# Patient Record
Sex: Female | Born: 1980
Health system: Southern US, Community
[De-identification: ages and names within clinical notes are randomized; demographics above are authoritative.]

---

## 2000-01-29 ENCOUNTER — Other Ambulatory Visit: Admission: RE | Admit: 2000-01-29 | Discharge: 2000-01-29 | Payer: Self-pay | Admitting: Obstetrics and Gynecology

## 2003-02-04 ENCOUNTER — Other Ambulatory Visit: Admission: RE | Admit: 2003-02-04 | Discharge: 2003-02-04 | Payer: Self-pay | Admitting: Obstetrics and Gynecology

## 2004-02-17 ENCOUNTER — Other Ambulatory Visit: Admission: RE | Admit: 2004-02-17 | Discharge: 2004-02-17 | Payer: Self-pay | Admitting: Obstetrics and Gynecology

## 2005-02-01 ENCOUNTER — Ambulatory Visit: Payer: Self-pay | Admitting: Family Medicine

## 2005-04-17 ENCOUNTER — Other Ambulatory Visit: Admission: RE | Admit: 2005-04-17 | Discharge: 2005-04-17 | Payer: Self-pay | Admitting: Obstetrics and Gynecology

## 2005-06-25 ENCOUNTER — Ambulatory Visit: Payer: Self-pay | Admitting: Family Medicine

## 2005-10-23 ENCOUNTER — Encounter (INDEPENDENT_AMBULATORY_CARE_PROVIDER_SITE_OTHER): Payer: Self-pay | Admitting: Specialist

## 2005-10-23 ENCOUNTER — Inpatient Hospital Stay (HOSPITAL_COMMUNITY): Admission: AD | Admit: 2005-10-23 | Discharge: 2005-10-25 | Payer: Self-pay | Admitting: Obstetrics and Gynecology

## 2006-03-28 ENCOUNTER — Ambulatory Visit: Payer: Self-pay | Admitting: Family Medicine

## 2006-04-07 ENCOUNTER — Ambulatory Visit: Payer: Self-pay | Admitting: Family Medicine

## 2006-04-23 ENCOUNTER — Other Ambulatory Visit: Admission: RE | Admit: 2006-04-23 | Discharge: 2006-04-23 | Payer: Self-pay | Admitting: Obstetrics and Gynecology

## 2006-10-27 ENCOUNTER — Ambulatory Visit (HOSPITAL_COMMUNITY): Admission: RE | Admit: 2006-10-27 | Discharge: 2006-10-27 | Payer: Self-pay | Admitting: Orthopedic Surgery

## 2007-04-01 ENCOUNTER — Encounter (INDEPENDENT_AMBULATORY_CARE_PROVIDER_SITE_OTHER): Payer: Self-pay | Admitting: Orthopedic Surgery

## 2007-04-01 ENCOUNTER — Ambulatory Visit (HOSPITAL_BASED_OUTPATIENT_CLINIC_OR_DEPARTMENT_OTHER): Admission: RE | Admit: 2007-04-01 | Discharge: 2007-04-01 | Payer: Self-pay | Admitting: Orthopedic Surgery

## 2007-06-19 ENCOUNTER — Encounter: Payer: Self-pay | Admitting: Family Medicine

## 2007-10-26 ENCOUNTER — Ambulatory Visit (HOSPITAL_COMMUNITY): Admission: RE | Admit: 2007-10-26 | Discharge: 2007-10-26 | Payer: Self-pay | Admitting: Obstetrics and Gynecology

## 2008-07-27 ENCOUNTER — Ambulatory Visit: Payer: Self-pay | Admitting: Family Medicine

## 2008-07-27 DIAGNOSIS — J209 Acute bronchitis, unspecified: Secondary | ICD-10-CM | POA: Insufficient documentation

## 2008-12-08 ENCOUNTER — Inpatient Hospital Stay (HOSPITAL_COMMUNITY): Admission: AD | Admit: 2008-12-08 | Discharge: 2008-12-08 | Payer: Self-pay | Admitting: Obstetrics and Gynecology

## 2008-12-09 ENCOUNTER — Inpatient Hospital Stay (HOSPITAL_COMMUNITY): Admission: AD | Admit: 2008-12-09 | Discharge: 2008-12-09 | Payer: Self-pay | Admitting: Obstetrics and Gynecology

## 2009-01-12 ENCOUNTER — Inpatient Hospital Stay (HOSPITAL_COMMUNITY): Admission: AD | Admit: 2009-01-12 | Discharge: 2009-01-12 | Payer: Self-pay | Admitting: Obstetrics and Gynecology

## 2009-01-13 ENCOUNTER — Inpatient Hospital Stay (HOSPITAL_COMMUNITY): Admission: AD | Admit: 2009-01-13 | Discharge: 2009-01-13 | Payer: Self-pay | Admitting: Obstetrics and Gynecology

## 2009-01-31 ENCOUNTER — Inpatient Hospital Stay (HOSPITAL_COMMUNITY): Admission: RE | Admit: 2009-01-31 | Discharge: 2009-02-03 | Payer: Self-pay | Admitting: Obstetrics and Gynecology

## 2009-01-31 ENCOUNTER — Ambulatory Visit: Payer: Self-pay | Admitting: Obstetrics and Gynecology

## 2009-01-31 ENCOUNTER — Encounter (INDEPENDENT_AMBULATORY_CARE_PROVIDER_SITE_OTHER): Payer: Self-pay | Admitting: Obstetrics and Gynecology

## 2011-01-23 LAB — CBC
HCT: 30.7 % — ABNORMAL LOW (ref 36.0–46.0)
HCT: 34.9 % — ABNORMAL LOW (ref 36.0–46.0)
MCHC: 34.1 g/dL (ref 30.0–36.0)
MCHC: 35.4 g/dL (ref 30.0–36.0)
MCV: 91.7 fL (ref 78.0–100.0)
MCV: 93.8 fL (ref 78.0–100.0)
RBC: 3.81 MIL/uL — ABNORMAL LOW (ref 3.87–5.11)
RDW: 14.6 % (ref 11.5–15.5)

## 2011-01-23 LAB — RPR: RPR Ser Ql: NONREACTIVE

## 2011-01-29 LAB — URINALYSIS, ROUTINE W REFLEX MICROSCOPIC
Bilirubin Urine: NEGATIVE
Glucose, UA: NEGATIVE mg/dL
Hgb urine dipstick: NEGATIVE
Nitrite: NEGATIVE
Protein, ur: NEGATIVE mg/dL
Urobilinogen, UA: 0.2 mg/dL (ref 0.0–1.0)
pH: 6.5 (ref 5.0–8.0)

## 2011-02-26 NOTE — Op Note (Signed)
NAME:  Lindsey Barron, Lindsey Barron             ACCOUNT NO.:  [id number]   MEDICAL RECORD NO.:  [id number]          PATIENT TYPE:  INP   LOCATION:  9108                          FACILITY:  [facility]   PHYSICIAN:  Sherron Monday, MD        DATE OF BIRTH:  01-12-1981   DATE OF PROCEDURE:  01/31/2009  DATE OF DISCHARGE:                               OPERATIVE REPORT   PREOPERATIVE DIAGNOSIS:  Intrauterine pregnancy at 33 plus 6 weeks,  twins, breech presentation of baby A, spontaneous rupture of membranes.   POSTOPERATIVE DIAGNOSIS:  Intrauterine pregnancy at 33 plus 6 weeks,  twins, breech presentation of baby A, spontaneous rupture of membranes,  delivered via LTCS.   PROCEDURE:  Primary low transverse cesarean section.   SURGEON:  Sherron Monday, MD   ANESTHESIA:  Spinal.   FINDINGS:  Viable female infant at 13:07 with Apgars of 9 at one minute  and 9 at five minutes, and weight of 4 pounds 11 ounces.  Viable female  infant at 13:08 with Apgars of 8 at one minute and 9 at five minutes,  and weight of 4 pounds 3 ounces.  Normal uterus, tubes, and no ovaries.   COMPLICATIONS:  None.   SPECIMENS:  Placenta to Pathology.   ESTIMATED BLOOD LOSS:  600 mL.   IV FLUIDS:  2600 mL.   URINE OUTPUT:  200 mL clear urine at the end of the procedure.   DISPOSITION:  Stable to PACU.   PROCEDURE:  After informed consent was reviewed with the patient and her  husband including risks, benefits, and alternatives of cesarean section,  she was transported to the operating room where spinal anesthesia was  induced and found to be adequate.  She was then placed in supine  position with a leftward tilt, prepped and draped in the normal sterile  fashion.  A Pfannenstiel skin incision was made approximately at the  level of approximately two fingerbreadths above the pubic symphysis and  carried through the underlying layer of fascia sharply.  Fascia was  incised in the midline and incision was extended laterally with  Mayo  scissors.  The inferior aspect of the fascial incision was grasped with  clamps and elevated, the rectus muscles were dissected off both bluntly  and sharply.  Attention was then turned to the superior aspect of the  incision in a similar fashion, grasped with Kocher clamps, elevating the  rectus muscles were dissected off both bluntly and sharply.  Midline was  easily identified and the peritoneum was entered bluntly and the  incision was extended superiorly and inferiorly with good visualization  of the bladder.  The Alexis skin retractor was placed, carefully checked  to make sure no bowel was entrapped.  The vesicouterine peritoneum was  easily identified, tented up with pickups and the bladder flap created  digitally and sharply.  The uterus was incised in transverse fashion.  Baby A was delivered from a frank breech presentation without  complications.  Her nose and mouth were suctioned.  Cord was clamped and  cut, and baby was handed off to awaiting pediatric staff.  Baby B's bag  was then ruptured, delivered from a vertex presentation.  Nose and mouth  were suctioned.  Cord was clamped and baby was handed off to awaiting  pediatric staff.  The placenta was delivered.  The uterus was cleared of  all clots and debris.  Uterine incision was closed with 2 layers of 0  Monocryl, the first of which is a running locked and the second is an  imbricating.  Hemostasis was assured.  Copious pelvic irrigation was  performed.  The Alexis skin retractor was removed. The peritoneum was  reapproximated with 2-0 vicryl.  Subfascial planes were inspected, found  be hemostatic.  Fascia was closed with single suture of 0 Vicryl,  subcuticular adipose layer was made hemostatic with Bovie cautery,  reapproximated with 3-0 Vicryl and the skin was closed with 3-0 Vicryl.  Benzoin and Steri-Strips were applied.  Sponge, lap, and, needle counts  were correct x2 at the end of the procedure.       Sherron Monday, MD  Electronically Signed     JB/MEDQ  D:  01/31/2009  T:  02/01/2009  Job:  [id number]

## 2011-02-26 NOTE — Discharge Summary (Signed)
Lindsey Barron, Lindsey Barron             ACCOUNT NO.:  [id number]   MEDICAL RECORD NO.:  [id number]          PATIENT TYPE:  INP   LOCATION:  9108                          [facility]ACILITY:  [facility]   PHYSICIAN:  Sherron Monday, MD        DATE OF BIRTH:  02/23/1981   DATE OF ADMISSION:  01/31/2009  DATE OF DISCHARGE:  02/03/2009                               DISCHARGE SUMMARY   ADMITTING DIAGNOSES:  1. Intrauterine pregnancy at 33+ weeks.  2. Term pregnancy.  3. Delivery secondary to preterm premature rupture of membranes and      laboring  4. Breech presentation of twin A.   DISCHARGE DIAGNOSES:  1. Intrauterine pregnancy at 33+ weeks.  2. Term pregnancy.  3. Delivery secondary to preterm premature rupture of membranes and      laboring  4. Breech presentation of twin A.  5. Delivered by a primary low transverse cesarean section.   HISTORY OF PRESENT ILLNESS:  Please see the dictated H&P for complete  history.  However, in brief, a 30 year old G2, P 1-0-0-1 at 87 plus 6  weeks with spontaneous rupture of membranes, gross rupture of baby A,  who comes in the office with contractions that are increasing in  intensity and frequency, transferred to the hospital where a primary low  transverse cesarean section was performed without complication with an  EBL of 600 mL and delivery of a viable female infant at 67 on the  20th, Apgars at 9 at 1 minute and 9 at 5 minutes, and a weight of 4  pounds 11 ounces.  Viable female infant at 68 with Apgars of 8 at 1  minute and 9 at 5 minutes, and a weight of 4 pounds 3 ounces.  Normal  uterus, tubes, and ovaries were noted.  Her postoperative course was  relatively uncomplicated.  She remained afebrile and vital signs stable  throughout her course.  On the day of discharge, postoperative day #3,  she was ambulating well, voiding, tolerating a diet, had normal lochia,  and her pain was well controlled.  She requested discharge to home.  She  was discharged to  home with routine discharge instructions, numbers to  call if any questions or problems, as well as prescriptions for Motrin,  Vicodin, prenatal vitamins and instructions to follow up in 2 weeks. At  the time of discharge, baby A will be discharged with the mother, baby B  was in the NICU on room air to feeding and growing.   DISCHARGE INFORMATION:  Her hemoglobin decreased from 12.4 to 10.5.  She  is rubella immune, B+, and she plans to breast-feed.  We will discuss  contraception at her 2-week checkup.      Sherron Monday, MD  Electronically Signed     JB/MEDQ  D:  02/03/2009  T:  02/03/2009  Job:  [id number]

## 2011-02-26 NOTE — H&P (Signed)
NAME:  Lindsey Barron, Lindsey Barron NO.:  [id number]   MEDICAL RECORD NO.:  [id number]          PATIENT TYPE:  MAT   LOCATION:  MATC                          [facility]ILITY[facility]   PHYSICIAN:  Sherron Monday, MD        DATE OF BIRTH:  25-Jun-1981   DATE OF ADMISSION:  01/31/2009  DATE OF DISCHARGE:                              HISTORY & PHYSICAL   ADMISSION DIAGNOSES:  1. Intrauterine pregnancy at 33+ weeks.  2. Twin pregnancy.  3. Treatment for preterm rupture of membranes, laboring.  4. Breech presentation of twin A.   PROCEDURE PLANNED:  Primary low transverse cesarean section.   HISTORY OF PRESENT ILLNESS:  A 30 year old G2, P1-0-0-1 35 and 6 weeks  who had a gush of clear fluid this morning.  She states she has had good  fetal movement.  No vaginal bleeding and contractions are increasing in  intensity and frequency.  Over the last 1/2 hour she states they have  been every 3-5 minutes and getting more intense.  She has been followed  throughout her pregnancy for early cervical dilatation as well as has  received two doses of betamethasone and she had been followed with  biweekly nonstress tests.   PAST MEDICAL HISTORY:  Nonsignificant.   SURGICAL HISTORY:  Significant for:  1. Wisdom teeth extraction.  2. Lumpectomy in her axilla of  breast tissue.   PAST OB/GYN HISTORY:  G1 was a term vaginal delivery of a female infant.  G2 is the present pregnancy conceived on Clomid, twin pregnancy with  early some cervical dilatation and two courses of betamethasone.  No  history of any abnormal Pap smears or sexually transmitted diseases.   MEDICATIONS:  1. Prenatal vitamins.  2. Procardia.   ALLERGIES:  No known drug allergies.   SOCIAL HISTORY:  The patient denies alcohol, tobacco or drug use.   FAMILY HISTORY:  The patient is adopted so she is unsure of her family  history.   PRENATAL LABS:  Hemoglobin 12.8, platelets 234,000.  RPR nonreactive.  Hepatitis B surface antigen  negative, rubella immune, HIV nonreactive.  Urine culture negative.  Pap smear within normal limits.  Blood type B+  screen, antibody screen negative, gonorrhea negative, chlamydia  negative,  cystic fibrosis screen negative.  First trimester screen  increased __________.  AFP within normal limits.  Ultrasound revealed  di/di female/ female twins and normal anatomy.  She was followed with  growth scans.  One hour GTT of 103.  Ultrasound revealed concordant  growth and a somewhat shortened cervix.   PHYSICAL EXAMINATION:  VITAL SIGNS:  Afebrile.  Vital signs stable.  GENERAL:  In no apparent distress.  CARDIOVASCULAR:  Regular rate and rhythm.  LUNGS:  Clear to auscultation bilaterally.  ABDOMEN:  Soft.  Fundus nontender.  EXTREMITIES:  Symmetric and nontender.  VAGINAL EXAM:  She is 4 to 5 cm dilated, 80% effaced and -1 station with  an obvious gross rupture of membranes for clear fluid.   ASSESSMENT AND PLAN:  A 30 year old G2, P1 at 60 plus 6 weeks with  spontaneous rupture of twin  A membrane with a breech presentation.  She  will undergo  a primary low transverse cesarean section.  I discussed  with the patient risks, benefits and alternatives as well as neonatology  made aware of the impending delivery.      Sherron Monday, MD  Electronically Signed     JB/MEDQ  D:  01/31/2009  T:  01/31/2009  Job:  [id number]

## 2011-03-01 NOTE — Op Note (Signed)
NAME:  Lindsey Barron, Lindsey Barron             ACCOUNT NO.:  [id number]   MEDICAL RECORD NO.:  [id number]          PATIENT TYPE:  AMB   LOCATION:  DSC                          FACILITY:  [facility]   PHYSICIAN:  Cindee Salt, M.D.       DATE OF BIRTH:  Oct 21, 1980   DATE OF PROCEDURE:  04/01/2007  DATE OF DISCHARGE:                               OPERATIVE REPORT   PREOPERATIVE DIAGNOSIS:  Ganglion cyst right wrist.   POSTOPERATIVE DIAGNOSIS:  Ganglion cyst right wrist.   OPERATION:  Excision ganglion cyst, flexor carpi radialis tendon of  right wrist.   SURGEON:  Dr. Merlyn Lot   ASSISTANT:  Carolyne Fiscal R.N.   ANESTHESIA:  Forearm based IV regional.   HISTORY:  The patient is a 30 year old female with a history of a mass  on the volar radial aspect along flexor carpi radialis tendon of her  right wrist.  She is desirous of removal of this, is well aware of risks  and complications including infection, recurrence, injury to arteries,  nerves, tendons, incomplete relief of symptoms, dystrophy.  She has  elected to proceed in the preoperative area, questions were encouraged  and answered.  The extremity marked by both the patient and surgeon,  antibiotics given.   PROCEDURE:  The patient is brought to the operating room where forearm  based IV regional anesthetic was carried out without difficulty.  She  was prepped using DuraPrep, supine position, right arm free.  A  longitudinal incision was made directly over the mass, carried down  through subcutaneous tissue.  Bleeders were electrocauterized.  The cyst  was noted beneath the sheath of flexor carpi radialis tendon.  This was  opened.  The cyst was immediately encountered.  This was found arise in  the substance of the flexor tendon.  The area of normal cyst location  was then opened to be certain that there was no extension.  No lesion  was found. The radial artery was protected.  The cyst was then excised  with blunt sharp dissection from the flexor  carpi radialis tendon,  leaving the tendon intact.  No further lesions.  No stalk was  identified.  The wound was irrigated.  The sheath was loosely closed  with interrupted 4-0 Vicryl Rapide sutures.  The skin with interrupted 4-  0 Vicryl Rapide sutures.  Sterile compressive dressing and splint was  applied.  The patient tolerated the procedure well.  On deflation of the  tourniquet all fingers immediately pinked.  She was taken to the  recovery room for observation in satisfactory condition.  She is  discharged home to return to [facility] [place] in 1 week on  Vicodin.           ______________________________  Cindee Salt, M.D.     GK/MEDQ  D:  04/01/2007  T:  04/01/2007  Job:  [id number]
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
## 2011-03-30 ENCOUNTER — Inpatient Hospital Stay (HOSPITAL_COMMUNITY)
Admission: AD | Admit: 2011-03-30 | Discharge: 2011-03-31 | DRG: 775 | Disposition: A | Discharge status: Home / Self Care | Payer: Managed Care, Other (non HMO) | Source: Ambulatory Visit | Attending: Obstetrics and Gynecology | Admitting: Obstetrics and Gynecology

## 2011-03-30 DIAGNOSIS — O34219 Maternal care for unspecified type scar from previous cesarean delivery: Secondary | ICD-10-CM | POA: Diagnosis present

## 2011-03-30 LAB — CBC
HCT: 36.9 % (ref 36.0–46.0)
Hemoglobin: 12.4 g/dL (ref 12.0–15.0)
RDW: 14.1 % (ref 11.5–15.5)

## 2011-03-31 LAB — CBC
HCT: 29.9 % — ABNORMAL LOW (ref 36.0–46.0)
WBC: 9.6 10*3/uL (ref 4.0–10.5)

## 2011-04-10 NOTE — Discharge Summary (Signed)
Lindsey Barron, Lindsey Barron NO.:  [id number]  MEDICAL RECORD NO.:  [id number]  LOCATION:  9118                          [facility]ACILITY:  [facility]  PHYSICIAN:  Sherron Monday, MD        DATE OF BIRTH:  [birth year]  DATE OF ADMISSION:  03/30/2011 DATE OF DISCHARGE:  03/31/2011                              DISCHARGE SUMMARY   ADMITTING DIAGNOSIS:  Intrauterine pregnancy at term for trial of labor.  DISCHARGE DIAGNOSES:  Intrauterine pregnancy at term for trial of labor, delivered via successful vaginal birth after cesarean section.  HISTORY OF PRESENT ILLNESS:  A 30 year old G4, P1-1-1-3 at 4 plus weeks for induction of labor.  The patient has history of low transverse cesarean section with twin intrauterine pregnancy.  I discussed at length with the patient in the office of risks, benefits, and alternatives of a VBAC including but not limited to uterine rupture, need for emergent C-section.  This pregnancy has been uncomplicated from history of subchorionic hemorrhage.  PAST MEDICAL HISTORY:  Not significant.  PAST SURGICAL HISTORY:  Significant for D and C, excision of ganglion cyst, and wisdom tooth extraction.  PAST OB/GYN HISTORY:  G1, 37 weeks, 6 pound 9 ounce female infant.  G2, 32 weeks, PROM, laboring twins for low transverse cesarean section presenting twin was breech.  G3 is a missed abortion, She had a D and C.  G4 is the present.  No abnormal Pap smear or sexually transmitted diseases.  MEDICATIONS:  Prenatal vitamins and Tylenol.  ALLERGIES:  No known drug allergies.  No latex allergies.  SOCIAL HISTORY:  Denies alcohol, tobacco, or drug use.  She is married.  FAMILY HISTORY:pt is adopted_.  PRENATAL LABS:  B positive, antibody screen negative.  Hemoglobin 12.8. Pap smear within normal limits.  Rubella immune, RPR nonreactive.  Urine culture negative, hepatitis B surface antigen negative, HIV negative, gonorrhea negative, Chlamydia negative.  Cystic  fibrosis screen negative.  First trimester screen within normal limits.  Glucola of 81. Group B strep was negative.  Ultrasound at 12 weeks revealed normal nuchal thickness and a subchorionic hemorrhage at 18 weeks, normal anatomy, anterior, posterior left-sided placenta confirmed the EDC.  PHYSICAL EXAMINATION:  On admission afebrile.  Vital signs stable with a benign exam.  Fetal heart tones in the 140s with good variability contractions every 1-3 minutes.  Cervix was 3 cm dilated, 80% effaced, and -3 station.  AROM was performed for clear fluids.  The patient rapidly progressed in labor to complete, complete +2, pushed for approximately 30 minutes to deliver viable female infant at 13:27 with Apgar's of 9 at 1 minute and 9 at 5 minutes and weighed 8 pounds 4 ounces.  Placenta, cord blood was collected.  Placenta was expressed intact at 13:30.  OB laceration left labial repaired with 3-0 Vicryl Rapide in typical fashion.  EBL was less than 500 mL.  Postpartum course was uncomplicated.  She remained afebrile.  Vital signs stable throughout.  Hemoglobin decreased from 12.4 to 10.4.  She requested discharge at 24 hours after delivery.  This was cleared by the pediatrician.  She would be discharged to home.  She voiced understanding.  She was given prescriptions  for Motrin, Vicodin, prenatal vitamins.  She is B positive, rubella immune, plans to breast-feed, hemoglobin decreased from 12.4 to 10.4.  She will discuss contraception further at 6-week check up.     Sherron Monday, MD     JB/MEDQ  D:  03/31/2011  T:  03/31/2011  Job:  [id number]  Electronically Signed by Sherron Monday MD on 04/10/2011 01:08:35 PM

## 2012-06-03 ENCOUNTER — Other Ambulatory Visit: Payer: Self-pay | Admitting: Obstetrics and Gynecology

## 2012-06-03 DIAGNOSIS — N6452 Nipple discharge: Secondary | ICD-10-CM

## 2012-06-03 DIAGNOSIS — N644 Mastodynia: Secondary | ICD-10-CM

## 2012-06-08 ENCOUNTER — Other Ambulatory Visit: Payer: Managed Care, Other (non HMO)

## 2012-06-09 ENCOUNTER — Ambulatory Visit
Admission: RE | Admit: 2012-06-09 | Discharge: 2012-06-09 | Disposition: A | Discharge status: Home / Self Care | Payer: Managed Care, Other (non HMO) | Source: Ambulatory Visit | Attending: Obstetrics and Gynecology | Admitting: Obstetrics and Gynecology

## 2012-06-09 DIAGNOSIS — N6452 Nipple discharge: Secondary | ICD-10-CM

## 2012-06-09 DIAGNOSIS — N644 Mastodynia: Secondary | ICD-10-CM

## 2013-12-03 ENCOUNTER — Ambulatory Visit (INDEPENDENT_AMBULATORY_CARE_PROVIDER_SITE_OTHER): Payer: Managed Care, Other (non HMO) | Admitting: Family Medicine

## 2013-12-03 ENCOUNTER — Encounter: Payer: Self-pay | Admitting: Family Medicine

## 2013-12-03 VITALS — BP 98/60 | HR 92 | Temp 99.5°F | Ht 63.0 in | Wt 125.0 lb

## 2013-12-03 DIAGNOSIS — J209 Acute bronchitis, unspecified: Secondary | ICD-10-CM

## 2013-12-03 MED ORDER — AMOXICILLIN-POT CLAVULANATE 400-57 MG/5ML PO SUSR: 10.0000 mL | Freq: Two times a day (BID) | ORAL | Status: DC

## 2013-12-03 NOTE — Progress Notes (Signed)
Pre visit review using our clinic review tool, if applicable. No additional management support is needed unless otherwise documented below in the visit note. 

## 2013-12-03 NOTE — Progress Notes (Signed)
   Subjective:    Patient ID: Lindsey Barron, female    DOB: [birth year], 33 y.o.   MRN: [id number]  HPI Here for 4 days of PND, chest tightness and coughing up green sputum. No fever. On Nyquil.    Review of Systems  Constitutional: Negative.   HENT: Positive for congestion and postnasal drip.   Eyes: Negative.   Respiratory: Positive for cough.        Objective:   Physical Exam  Constitutional: She appears well-developed and well-nourished.  HENT:  Right Ear: External ear normal.  Left Ear: External ear normal.  Nose: Nose normal.  Mouth/Throat: Oropharynx is clear and moist.  Eyes: Conjunctivae are normal.  Pulmonary/Chest: Effort normal. No respiratory distress. She has no wheezes. She has no rales.  Scattered rhonchi   Lymphadenopathy:    She has no cervical adenopathy.          Assessment & Plan:  Add Mucinex

## 2015-12-05 ENCOUNTER — Other Ambulatory Visit: Payer: Self-pay | Admitting: Obstetrics and Gynecology

## 2015-12-05 DIAGNOSIS — N63 Unspecified lump in unspecified breast: Secondary | ICD-10-CM

## 2015-12-12 ENCOUNTER — Ambulatory Visit
Admission: RE | Admit: 2015-12-12 | Discharge: 2015-12-12 | Disposition: A | Discharge status: Home / Self Care | Payer: 59 | Source: Ambulatory Visit | Attending: Obstetrics and Gynecology | Admitting: Obstetrics and Gynecology

## 2015-12-12 ENCOUNTER — Other Ambulatory Visit: Payer: Managed Care, Other (non HMO)

## 2015-12-12 DIAGNOSIS — N63 Unspecified lump in unspecified breast: Secondary | ICD-10-CM

## 2016-07-03 ENCOUNTER — Ambulatory Visit (INDEPENDENT_AMBULATORY_CARE_PROVIDER_SITE_OTHER): Payer: 59 | Admitting: Family Medicine

## 2016-07-03 ENCOUNTER — Encounter: Payer: Self-pay | Admitting: Family Medicine

## 2016-07-03 VITALS — BP 98/54 | HR 73 | Temp 98.7°F | Ht 63.0 in | Wt 124.0 lb

## 2016-07-03 DIAGNOSIS — D171 Benign lipomatous neoplasm of skin and subcutaneous tissue of trunk: Secondary | ICD-10-CM | POA: Diagnosis not present

## 2016-07-03 NOTE — Progress Notes (Signed)
   Subjective:    Patient ID: Lindsey Barron, female    DOB: [birth year], 35 y.o.   MRN: [id number]  HPI Here to check a lump on the abdomen that she noticed in June. It does not bother her and it has not changed at all.    Review of Systems  Constitutional: Negative.   Respiratory: Negative.   Cardiovascular: Negative.   Gastrointestinal: Negative.        Objective:   Physical Exam  Constitutional: She appears well-developed and well-nourished.  Abdominal: Soft. Bowel sounds are normal. She exhibits no distension. There is no tenderness. There is no rebound and no guarding.  There is a small firm mobile non-tender lump just under the skin in the left upper abdomen           Assessment & Plan:  Lipoma, she was reassured this is benign and does not require treatment.

## 2016-07-03 NOTE — Progress Notes (Signed)
Pre visit review using our clinic review tool, if applicable. No additional management support is needed unless otherwise documented below in the visit note. 

## 2017-02-10 ENCOUNTER — Ambulatory Visit: Payer: 59 | Admitting: Family Medicine

## 2017-02-10 ENCOUNTER — Ambulatory Visit (INDEPENDENT_AMBULATORY_CARE_PROVIDER_SITE_OTHER): Payer: 59 | Admitting: Family Medicine

## 2017-02-10 ENCOUNTER — Encounter: Payer: Self-pay | Admitting: Family Medicine

## 2017-02-10 ENCOUNTER — Ambulatory Visit (INDEPENDENT_AMBULATORY_CARE_PROVIDER_SITE_OTHER)
Admission: RE | Admit: 2017-02-10 | Discharge: 2017-02-10 | Disposition: A | Discharge status: Home / Self Care | Payer: 59 | Source: Ambulatory Visit | Attending: Family Medicine | Admitting: Family Medicine

## 2017-02-10 VITALS — BP 98/63 | HR 68 | Temp 98.4°F | Ht 63.0 in | Wt 126.0 lb

## 2017-02-10 DIAGNOSIS — S0083XA Contusion of other part of head, initial encounter: Secondary | ICD-10-CM

## 2017-02-10 NOTE — Progress Notes (Signed)
Pre visit review using our clinic review tool, if applicable. No additional management support is needed unless otherwise documented below in the visit note. 

## 2017-02-10 NOTE — Progress Notes (Signed)
   Subjective:    Patient ID: Lindsey Barron, female    DOB: Sep 19, 1981, 36 y.o.   MRN: [id number]  HPI Her to check on her nose. Last night while playing with her dogs, one of the dogs jumped up and his head struck her in the nose. She felt and heard a crack and felt pain in the nose. She had bleeding at first but they stopped this with pressure. She applied ice to the nose. Today the swelling is down and the pain is tolerable. She can breathe easily through both nostrils.    Review of Systems  Constitutional: Negative.   HENT: Positive for facial swelling and nosebleeds. Negative for congestion, dental problem, ear pain, postnasal drip, rhinorrhea and sinus pain.   Eyes: Negative.   Respiratory: Negative.        Objective:   Physical Exam  Constitutional: She is oriented to person, place, and time. She appears well-developed and well-nourished.  HENT:  Right Ear: External ear normal.  Left Ear: External ear normal.  Mouth/Throat: Oropharynx is clear and moist.  The nose is deviated slightly to the side. She is mildly tender over the bridge of the nose but there is no crepitus  Eyes: Conjunctivae are normal. Pupils are equal, round, and reactive to light.  Neck: Neck supple. No thyromegaly present.  Lymphadenopathy:    She has no cervical adenopathy.  Neurological: She is alert and oriented to person, place, and time. No cranial nerve deficit.          Assessment & Plan:  Facial contusion, doubt a fracture. We will send her for Xrays of the nose. Use ice and Tylenol prn.

## 2017-06-29 DIAGNOSIS — L255 Unspecified contact dermatitis due to plants, except food: Secondary | ICD-10-CM | POA: Diagnosis not present

## 2017-07-03 ENCOUNTER — Encounter: Payer: Self-pay | Admitting: Family Medicine

## 2017-09-25 DIAGNOSIS — N926 Irregular menstruation, unspecified: Secondary | ICD-10-CM | POA: Diagnosis not present

## 2017-10-13 ENCOUNTER — Encounter: Payer: Self-pay | Admitting: Urgent Care

## 2017-10-13 ENCOUNTER — Ambulatory Visit (INDEPENDENT_AMBULATORY_CARE_PROVIDER_SITE_OTHER): Payer: 59

## 2017-10-13 ENCOUNTER — Ambulatory Visit: Payer: 59 | Admitting: Urgent Care

## 2017-10-13 ENCOUNTER — Telehealth: Payer: Self-pay | Admitting: Family Medicine

## 2017-10-13 VITALS — BP 110/60 | HR 75 | Temp 98.4°F | Resp 16 | Ht 63.0 in | Wt 132.6 lb

## 2017-10-13 DIAGNOSIS — J189 Pneumonia, unspecified organism: Secondary | ICD-10-CM

## 2017-10-13 DIAGNOSIS — R05 Cough: Secondary | ICD-10-CM

## 2017-10-13 DIAGNOSIS — R5383 Other fatigue: Secondary | ICD-10-CM

## 2017-10-13 DIAGNOSIS — R059 Cough, unspecified: Secondary | ICD-10-CM

## 2017-10-13 MED ORDER — AZITHROMYCIN 100 MG/5ML PO SUSR: ORAL | 0 refills | Status: DC

## 2017-10-13 MED ORDER — BENZONATATE 100 MG PO CAPS: 100.0000 mg | ORAL_CAPSULE | Freq: Two times a day (BID) | ORAL | 0 refills | Status: DC | PRN

## 2017-10-13 MED ORDER — HYDROCODONE-HOMATROPINE 5-1.5 MG/5ML PO SYRP: 5.0000 mL | ORAL_SOLUTION | Freq: Three times a day (TID) | ORAL | 0 refills | Status: DC | PRN

## 2017-10-13 MED ORDER — AZITHROMYCIN 250 MG PO TABS: ORAL_TABLET | ORAL | 0 refills | Status: DC

## 2017-10-13 NOTE — Telephone Encounter (Signed)
Sent to PCP. I believe you and Hoyle Sauer discuss about this today.

## 2017-10-13 NOTE — Telephone Encounter (Signed)
Beverlee Nims, from [facility] calling to confirm chest x-ray results were available in pt's chart and that the doctor was aware of results. Chest X-ray impression: Pneumonia in the inferior lingula. Lungs elsewhere clear. No adenopathy.

## 2017-10-13 NOTE — Patient Instructions (Addendum)
Community-Acquired Pneumonia, Adult Pneumonia is an infection of the lungs. There are different types of pneumonia. One type can develop while a person is in a hospital. A different type, called community-acquired pneumonia, develops in people who are not, or have not recently been, in the hospital or other health care facility. What are the causes? Pneumonia may be caused by bacteria, viruses, or funguses. Community-acquired pneumonia is often caused by Streptococcus pneumonia bacteria. These bacteria are often passed from one person to another by breathing in droplets from the cough or sneeze of an infected person. What increases the risk? The condition is more likely to develop in:  People who havechronic diseases, such as chronic obstructive pulmonary disease (COPD), asthma, congestive heart failure, cystic fibrosis, diabetes, or kidney disease.  People who haveearly-stage or late-stage HIV.  People who havesickle cell disease.  People who havehad their spleen removed (splenectomy).  People who havepoor dental hygiene.  People who havemedical conditions that increase the risk of breathing in (aspirating) secretions their own mouth and nose.  People who havea weakened immune system (immunocompromised).  People who smoke.  People whotravel to areas where pneumonia-causing germs commonly exist.  People whoare around animal habitats or animals that have pneumonia-causing germs, including birds, bats, rabbits, cats, and farm animals.  What are the signs or symptoms? Symptoms of this condition include:  Adry cough.  A wet (productive) cough.  Fever.  Sweating.  Chest pain, especially when breathing deeply or coughing.  Rapid breathing or difficulty breathing.  Shortness of breath.  Shaking chills.  Fatigue.  Muscle aches.  How is this diagnosed? Your health care provider will take a medical history and perform a physical exam. You may also have other tests,  including:  Imaging studies of your chest, including X-rays.  Tests to check your blood oxygen level and other blood gases.  Other tests on blood, mucus (sputum), fluid around your lungs (pleural fluid), and urine.  If your pneumonia is severe, other tests may be done to identify the specific cause of your illness. How is this treated? The type of treatment that you receive depends on many factors, such as the cause of your pneumonia, the medicines you take, and other medical conditions that you have. For most adults, treatment and recovery from pneumonia may occur at home. In some cases, treatment must happen in a hospital. Treatment may include:  Antibiotic medicines, if the pneumonia was caused by bacteria.  Antiviral medicines, if the pneumonia was caused by a virus.  Medicines that are given by mouth or through an IV tube.  Oxygen.  Respiratory therapy.  Although rare, treating severe pneumonia may include:  Mechanical ventilation. This is done if you are not breathing well on your own and you cannot maintain a safe blood oxygen level.  Thoracentesis. This procedureremoves fluid around one lung or both lungs to help you breathe better.  Follow these instructions at home:  Take over-the-counter and prescription medicines only as told by your health care provider. ? Only takecough medicine if you are losing sleep. Understand that cough medicine can prevent your body's natural ability to remove mucus from your lungs. ? If you were prescribed an antibiotic medicine, take it as told by your health care provider. Do not stop taking the antibiotic even if you start to feel better.  Sleep in a semi-upright position at night. Try sleeping in a reclining chair, or place a few pillows under your head.  Do not use tobacco products, including cigarettes, chewing   tobacco, and e-cigarettes. If you need help quitting, ask your health care provider.  Drink enough water to keep your urine  clear or pale yellow. This will help to thin out mucus secretions in your lungs. How is this prevented? There are ways that you can decrease your risk of developing community-acquired pneumonia. Consider getting a pneumococcal vaccine if:  You are older than 37 years of age.  You are older than 37 years of age and are undergoing cancer treatment, have chronic lung disease, or have other medical conditions that affect your immune system. Ask your health care provider if this applies to you.  There are different types and schedules of pneumococcal vaccines. Ask your health care provider which vaccination option is best for you. You may also prevent community-acquired pneumonia if you take these actions:  Get an influenza vaccine every year. Ask your health care provider which type of influenza vaccine is best for you.  Go to the dentist on a regular basis.  Wash your hands often. Use hand sanitizer if soap and water are not available.  Contact a health care provider if:  You have a fever.  You are losing sleep because you cannot control your cough with cough medicine. Get help right away if:  You have worsening shortness of breath.  You have increased chest pain.  Your sickness becomes worse, especially if you are an older adult or have a weakened immune system.  You cough up blood. This information is not intended to replace advice given to you by your health care provider. Make sure you discuss any questions you have with your health care provider. Document Released: 09/30/2005 Document Revised: 02/08/2016 Document Reviewed: 01/25/2015 Elsevier Interactive Patient Education  2018 [place].    Cough, Adult A cough helps to clear your throat and lungs. A cough may last only 2-3 weeks (acute), or it may last longer than 8 weeks (chronic). Many different things can cause a cough. A cough may be a sign of an illness or another medical condition. Follow these instructions at  home:  Pay attention to any changes in your cough.  Take medicines only as told by your doctor. ? If you were prescribed an antibiotic medicine, take it as told by your doctor. Do not stop taking it even if you start to feel better. ? Talk with your doctor before you try using a cough medicine.  Drink enough fluid to keep your pee (urine) clear or pale yellow.  If the air is dry, use a cold steam vaporizer or humidifier in your home.  Stay away from things that make you cough at work or at home.  If your cough is worse at night, try using extra pillows to raise your head up higher while you sleep.  Do not smoke, and try not to be around smoke. If you need help quitting, ask your doctor.  Do not have caffeine.  Do not drink alcohol.  Rest as needed. Contact a doctor if:  You have new problems (symptoms).  You cough up yellow fluid (pus).  Your cough does not get better after 2-3 weeks, or your cough gets worse.  Medicine does not help your cough and you are not sleeping well.  You have pain that gets worse or pain that is not helped with medicine.  You have a fever.  You are losing weight and you do not know why.  You have night sweats. Get help right away if:  You cough up blood.  You have trouble breathing.  Your heartbeat is very fast. This information is not intended to replace advice given to you by your health care provider. Make sure you discuss any questions you have with your health care provider. Document Released: 06/13/2011 Document Revised: 03/07/2016 Document Reviewed: 12/07/2014 Elsevier Interactive Patient Education  2018 [employer].     IF you received an x-ray today, you will receive an invoice from [facility]. Please contact [facility] at [phone] with questions or concerns regarding your invoice.   IF you received labwork today, you will receive an invoice from [place] [facility]. Please contact [facility] at [phone] with  questions or concerns regarding your invoice.   Our billing staff will not be able to assist you with questions regarding bills from these companies.  You will be contacted with the lab results as soon as they are available. The fastest way to get your results is to activate your My Chart account. Instructions are located on the last page of this paperwork. If you have not heard from [place] regarding the results in 2 weeks, please contact this office.    '

## 2017-10-13 NOTE — Progress Notes (Signed)
  MRN: [id number] DOB: 01-13-1981  Subjective:   Lindsey Barron is a 36 y.o. female presenting for 4 day history of productive cough that elicits chest pain, fatigue. Has also had fever (highest was 100.71F), sweats, chills. Has tried Advil, NyQuil with minimal relief. Denies sinus pain, ear pain, ear drainage, dizziness, sore throat, shob, wheezing, n/v, abdominal pain, rashes, body aches. Denies history of allergies, asthma. Denies smoking cigarettes or drinking alcohol.   Richetta currently has no medications in their medication list. Also has No Known Allergies.  Raynie denies past medical and surgical history.    Objective:   Vitals: BP 110/60   Pulse 75   Temp 98.4 F (36.9 C) (Oral)   Resp 16   Ht 5\' 3"  (1.6 m)   Wt 132 lb 9.6 oz (60.1 kg)   SpO2 99%   BMI 23.49 kg/m   Physical Exam  Constitutional: She is oriented to person, place, and time. She appears well-developed and well-nourished.  HENT:  TM's intact bilaterally, no effusions or erythema. Nasal turbinates pink and moist, nasal passages patent. No sinus tenderness. Oropharynx clear, mucous membranes moist.  Eyes: Right eye exhibits no discharge. Left eye exhibits no discharge.  Neck: Normal range of motion. Neck supple.  Cardiovascular: Normal rate, regular rhythm and intact distal pulses. Exam reveals no gallop and no friction rub.  No murmur heard. Pulmonary/Chest: No respiratory distress. She has no wheezes. She has rales (left sided, worse in lower lung field).  Lymphadenopathy:    She has no cervical adenopathy.  Neurological: She is alert and oriented to person, place, and time.  Skin: Skin is warm and dry. No rash noted.  Psychiatric: She has a normal mood and affect.    Assessment and Plan :   Community acquired pneumonia of left lung, unspecified part of lung - Plan: DG Chest 2 View  Cough - Plan: DG Chest 2 View  Other fatigue - Plan: DG Chest 2 View   Patient started on azithromycin, cough  suppression medications. I do not suspect chest PE. Radiology report pending. Return-to-clinic precautions discussed, patient verbalized understanding.   Jaynee Eagles, PA-C [facility] [phone] 10/13/2017  11:12 AM   UPDATE: Radiology report  Dg Chest 2 View  Result Date: 10/13/2017 CLINICAL DATA:  Left-sided rales EXAM: CHEST  2 VIEW COMPARISON:  None. FINDINGS: There is focal airspace consolidation consistent with pneumonia in the inferior lingula. Lungs elsewhere clear. Heart size and pulmonary vascularity are normal. No adenopathy. No bone lesions. IMPRESSION: Pneumonia in the inferior lingula. Lungs elsewhere clear. No adenopathy. These results will be called to the ordering clinician or representative by the Radiologist Assistant, and communication documented in the PACS or zVision Dashboard. Electronically Signed   By: Lowella Grip III M.D.   On: 10/13/2017 11:38

## 2017-10-16 NOTE — Telephone Encounter (Signed)
Noted. She was treated appropriately and she can follow up as needed.

## 2017-11-03 ENCOUNTER — Encounter: Payer: Self-pay | Admitting: Urgent Care

## 2017-11-03 ENCOUNTER — Ambulatory Visit: Payer: 59 | Admitting: Urgent Care

## 2017-11-03 ENCOUNTER — Ambulatory Visit (INDEPENDENT_AMBULATORY_CARE_PROVIDER_SITE_OTHER): Payer: 59

## 2017-11-03 VITALS — BP 96/61 | HR 82 | Temp 98.6°F | Resp 16 | Ht 63.0 in | Wt 130.4 lb

## 2017-11-03 DIAGNOSIS — R059 Cough, unspecified: Secondary | ICD-10-CM

## 2017-11-03 DIAGNOSIS — R5383 Other fatigue: Secondary | ICD-10-CM

## 2017-11-03 DIAGNOSIS — R0981 Nasal congestion: Secondary | ICD-10-CM

## 2017-11-03 DIAGNOSIS — R0982 Postnasal drip: Secondary | ICD-10-CM

## 2017-11-03 DIAGNOSIS — R05 Cough: Secondary | ICD-10-CM | POA: Diagnosis not present

## 2017-11-03 DIAGNOSIS — J189 Pneumonia, unspecified organism: Secondary | ICD-10-CM

## 2017-11-03 DIAGNOSIS — J181 Lobar pneumonia, unspecified organism: Secondary | ICD-10-CM | POA: Diagnosis not present

## 2017-11-03 MED ORDER — PSEUDOEPHEDRINE HCL ER 120 MG PO TB12: 120.0000 mg | ORAL_TABLET | Freq: Two times a day (BID) | ORAL | 3 refills | Status: DC

## 2017-11-03 MED ORDER — AMOXICILLIN 250 MG/5ML PO SUSR: 500.0000 mg | Freq: Three times a day (TID) | ORAL | 0 refills | Status: DC

## 2017-11-03 NOTE — Patient Instructions (Addendum)
Sinusitis, Adult Sinusitis is soreness and inflammation of your sinuses. Sinuses are hollow spaces in the bones around your face. Your sinuses are located:  Around your eyes.  In the middle of your forehead.  Behind your nose.  In your cheekbones.  Your sinuses and nasal passages are lined with a stringy fluid (mucus). Mucus normally drains out of your sinuses. When your nasal tissues become inflamed or swollen, the mucus can become trapped or blocked so air cannot flow through your sinuses. This allows bacteria, viruses, and funguses to grow, which leads to infection. Sinusitis can develop quickly and last for 7?10 days (acute) or for more than 12 weeks (chronic). Sinusitis often develops after a cold. What are the causes? This condition is caused by anything that creates swelling in the sinuses or stops mucus from draining, including:  Allergies.  Asthma.  Bacterial or viral infection.  Abnormally shaped bones between the nasal passages.  Nasal growths that contain mucus (nasal polyps).  Narrow sinus openings.  Pollutants, such as chemicals or irritants in the air.  A foreign object stuck in the nose.  A fungal infection. This is rare.  What increases the risk? The following factors may make you more likely to develop this condition:  Having allergies or asthma.  Having had a recent cold or respiratory tract infection.  Having structural deformities or blockages in your nose or sinuses.  Having a weak immune system.  Doing a lot of swimming or diving.  Overusing nasal sprays.  Smoking.  What are the signs or symptoms? The main symptoms of this condition are pain and a feeling of pressure around the affected sinuses. Other symptoms include:  Upper toothache.  Earache.  Headache.  Bad breath.  Decreased sense of smell and taste.  A cough that may get worse at night.  Fatigue.  Fever.  Thick drainage from your nose. The drainage is often green and  it may contain pus (purulent).  Stuffy nose or congestion.  Postnasal drip. This is when extra mucus collects in the throat or back of the nose.  Swelling and warmth over the affected sinuses.  Sore throat.  Sensitivity to light.  How is this diagnosed? This condition is diagnosed based on symptoms, a medical history, and a physical exam. To find out if your condition is acute or chronic, your health care provider may:  Look in your nose for signs of nasal polyps.  Tap over the affected sinus to check for signs of infection.  View the inside of your sinuses using an imaging device that has a light attached (endoscope).  If your health care provider suspects that you have chronic sinusitis, you may also:  Be tested for allergies.  Have a sample of mucus taken from your nose (nasal culture) and checked for bacteria.  Have a mucus sample examined to see if your sinusitis is related to an allergy.  If your sinusitis does not respond to treatment and it lasts longer than 8 weeks, you may have an MRI or CT scan to check your sinuses. These scans also help to determine how severe your infection is. In rare cases, a bone biopsy may be done to rule out more serious types of fungal sinus disease. How is this treated? Treatment for sinusitis depends on the cause and whether your condition is chronic or acute. If a virus is causing your sinusitis, your symptoms will go away on their own within 10 days. You may be given medicines to relieve your symptoms,   including:  Topical nasal decongestants. They shrink swollen nasal passages and let mucus drain from your sinuses.  Antihistamines. These drugs block inflammation that is triggered by allergies. This can help to ease swelling in your nose and sinuses.  Topical nasal corticosteroids. These are nasal sprays that ease inflammation and swelling in your nose and sinuses.  Nasal saline washes. These rinses can help to get rid of thick mucus in  your nose.  If your condition is caused by bacteria, you will be given an antibiotic medicine. If your condition is caused by a fungus, you will be given an antifungal medicine. Surgery may be needed to correct underlying conditions, such as narrow nasal passages. Surgery may also be needed to remove polyps. Follow these instructions at home: Medicines  Take, use, or apply over-the-counter and prescription medicines only as told by your health care provider. These may include nasal sprays.  If you were prescribed an antibiotic medicine, take it as told by your health care provider. Do not stop taking the antibiotic even if you start to feel better. Hydrate and Humidify  Drink enough water to keep your urine clear or pale yellow. Staying hydrated will help to thin your mucus.  Use a cool mist humidifier to keep the humidity level in your home above 50%.  Inhale steam for 10-15 minutes, 3-4 times a day or as told by your health care provider. You can do this in the bathroom while a hot shower is running.  Limit your exposure to cool or dry air. Rest  Rest as much as possible.  Sleep with your head raised (elevated).  Make sure to get enough sleep each night. General instructions  Apply a warm, moist washcloth to your face 3-4 times a day or as told by your health care provider. This will help with discomfort.  Wash your hands often with soap and water to reduce your exposure to viruses and other germs. If soap and water are not available, use hand sanitizer.  Do not smoke. Avoid being around people who are smoking (secondhand smoke).  Keep all follow-up visits as told by your health care provider. This is important. Contact a health care provider if:  You have a fever.  Your symptoms get worse.  Your symptoms do not improve within 10 days. Get help right away if:  You have a severe headache.  You have persistent vomiting.  You have pain or swelling around your face or  eyes.  You have vision problems.  You develop confusion.  Your neck is stiff.  You have trouble breathing. This information is not intended to replace advice given to you by your health care provider. Make sure you discuss any questions you have with your health care provider. Document Released: 09/30/2005 Document Revised: 05/26/2016 Document Reviewed: 07/26/2015 Elsevier Interactive Patient Education  2018 [employer].     IF you received an x-ray today, you will receive an invoice from [facility]. Please contact [facility] at [phone] with questions or concerns regarding your invoice.   IF you received labwork today, you will receive an invoice from [place]. Please contact [facility] at [phone] with questions or concerns regarding your invoice.   Our billing staff will not be able to assist you with questions regarding bills from these companies.  You will be contacted with the lab results as soon as they are available. The fastest way to get your results is to activate your My Chart account. Instructions are located on the last page  of this paperwork. If you have not heard from [place] regarding the results in 2 weeks, please contact this office.

## 2017-11-03 NOTE — Progress Notes (Signed)
    MRN: [id number] DOB: [birth year]  Subjective:   Lindsey Barron is a 37 y.o. female presenting for follow up on lingular pneumonia. Had improvement in her symptoms except for this past weekend. Started having nasal congestion, cough returned, fever (highest was 100.61F), headaches, chills. Denies chest pain, sore throat, shob, wheezing, n/v, abdominal pain. Patient had flu shot 07/2017. Denies smoking cigarettes.   Jakira has a current medication list which includes the following prescription(s): azithromycin, azithromycin, benzonatate, and hydrocodone-homatropine. Also has No Known Allergies.  Ryn denies past medical and surgical history.   Objective:   Vitals: BP 96/61   Pulse 82   Temp 98.6 F (37 C) (Oral)   Resp 16   Ht 5\' 3"  (1.6 m)   Wt 130 lb 6.4 oz (59.1 kg)   SpO2 97%   BMI 23.10 kg/m   Physical Exam  Constitutional: She is oriented to person, place, and time. She appears well-developed and well-nourished.  HENT:  TM's intact bilaterally, no effusions or erythema. Nasal turbinates pink, moist, nasal passages moerately patent. No sinus tenderness. Oropharynx with thick streaks of post-nasal drainage, mucous membranes moist.   Eyes: No scleral icterus.  Neck: Normal range of motion. Neck supple.  Cardiovascular: Normal rate, regular rhythm and intact distal pulses. Exam reveals no gallop and no friction rub.  No murmur heard. Pulmonary/Chest: No respiratory distress. She has no wheezes. She has no rales.  Lymphadenopathy:    She has no cervical adenopathy.  Neurological: She is alert and oriented to person, place, and time.  Skin: Skin is warm and dry.  Psychiatric: She has a normal mood and affect.   Dg Chest 2 View  Result Date: 11/03/2017 CLINICAL DATA:  Community acquired pneumonia involving the left lung. EXAM: CHEST  2 VIEW COMPARISON:  10/13/2017 FINDINGS: Grossly unchanged cardiac silhouette and mediastinal contours. Ill-defined heterogeneous airspace  opacities within the lingula have resolved in the interval. No new focal airspace opacities. No pleural effusion or pneumothorax. No evidence of edema. No acute osseous abnormalities. IMPRESSION: 1.  No acute cardiopulmonary disease. 2. Resolved lingular pneumonia. Electronically Signed   By: Sandi Mariscal M.D.   On: 11/03/2017 09:59   Assessment and Plan :   Community acquired pneumonia of left lung, unspecified part of lung - Plan: DG Chest 2 View  Cough  Other fatigue  Nasal congestion  Post-nasal drainage  Pneumonia is resolved. However, patient may now be undergoing viral illness versus uncomplicated bacterial sinusitis. She will try supportive care, provided patient with script for amoxicillin to address bacterial sinusitis if she has no improvement with supportive care. Return-to-clinic precautions discussed, patient verbalized understanding.   Jaynee Eagles, PA-C [facility] [phone] 11/03/2017 9:46 AM

## 2017-11-05 DIAGNOSIS — M545 Low back pain: Secondary | ICD-10-CM | POA: Diagnosis not present

## 2017-11-05 DIAGNOSIS — M9903 Segmental and somatic dysfunction of lumbar region: Secondary | ICD-10-CM | POA: Diagnosis not present

## 2017-11-05 DIAGNOSIS — M62830 Muscle spasm of back: Secondary | ICD-10-CM | POA: Diagnosis not present

## 2017-11-10 DIAGNOSIS — M545 Low back pain: Secondary | ICD-10-CM | POA: Diagnosis not present

## 2017-11-10 DIAGNOSIS — M62830 Muscle spasm of back: Secondary | ICD-10-CM | POA: Diagnosis not present

## 2017-11-10 DIAGNOSIS — M9903 Segmental and somatic dysfunction of lumbar region: Secondary | ICD-10-CM | POA: Diagnosis not present

## 2018-04-01 DIAGNOSIS — Z01419 Encounter for gynecological examination (general) (routine) without abnormal findings: Secondary | ICD-10-CM | POA: Diagnosis not present

## 2018-04-01 DIAGNOSIS — Z6824 Body mass index (BMI) 24.0-24.9, adult: Secondary | ICD-10-CM | POA: Diagnosis not present

## 2018-04-01 DIAGNOSIS — Z130 Encounter for screening for diseases of the blood and blood-forming organs and certain disorders involving the immune mechanism: Secondary | ICD-10-CM | POA: Diagnosis not present

## 2018-04-01 DIAGNOSIS — R61 Generalized hyperhidrosis: Secondary | ICD-10-CM | POA: Diagnosis not present

## 2018-08-10 IMAGING — DX DG CHEST 2V
2 series · 2 of 2 positions shown · non-contrast
Comparison: None.

CLINICAL DATA: Left-sided rales

EXAM:
CHEST  2 VIEW

[chest pa]
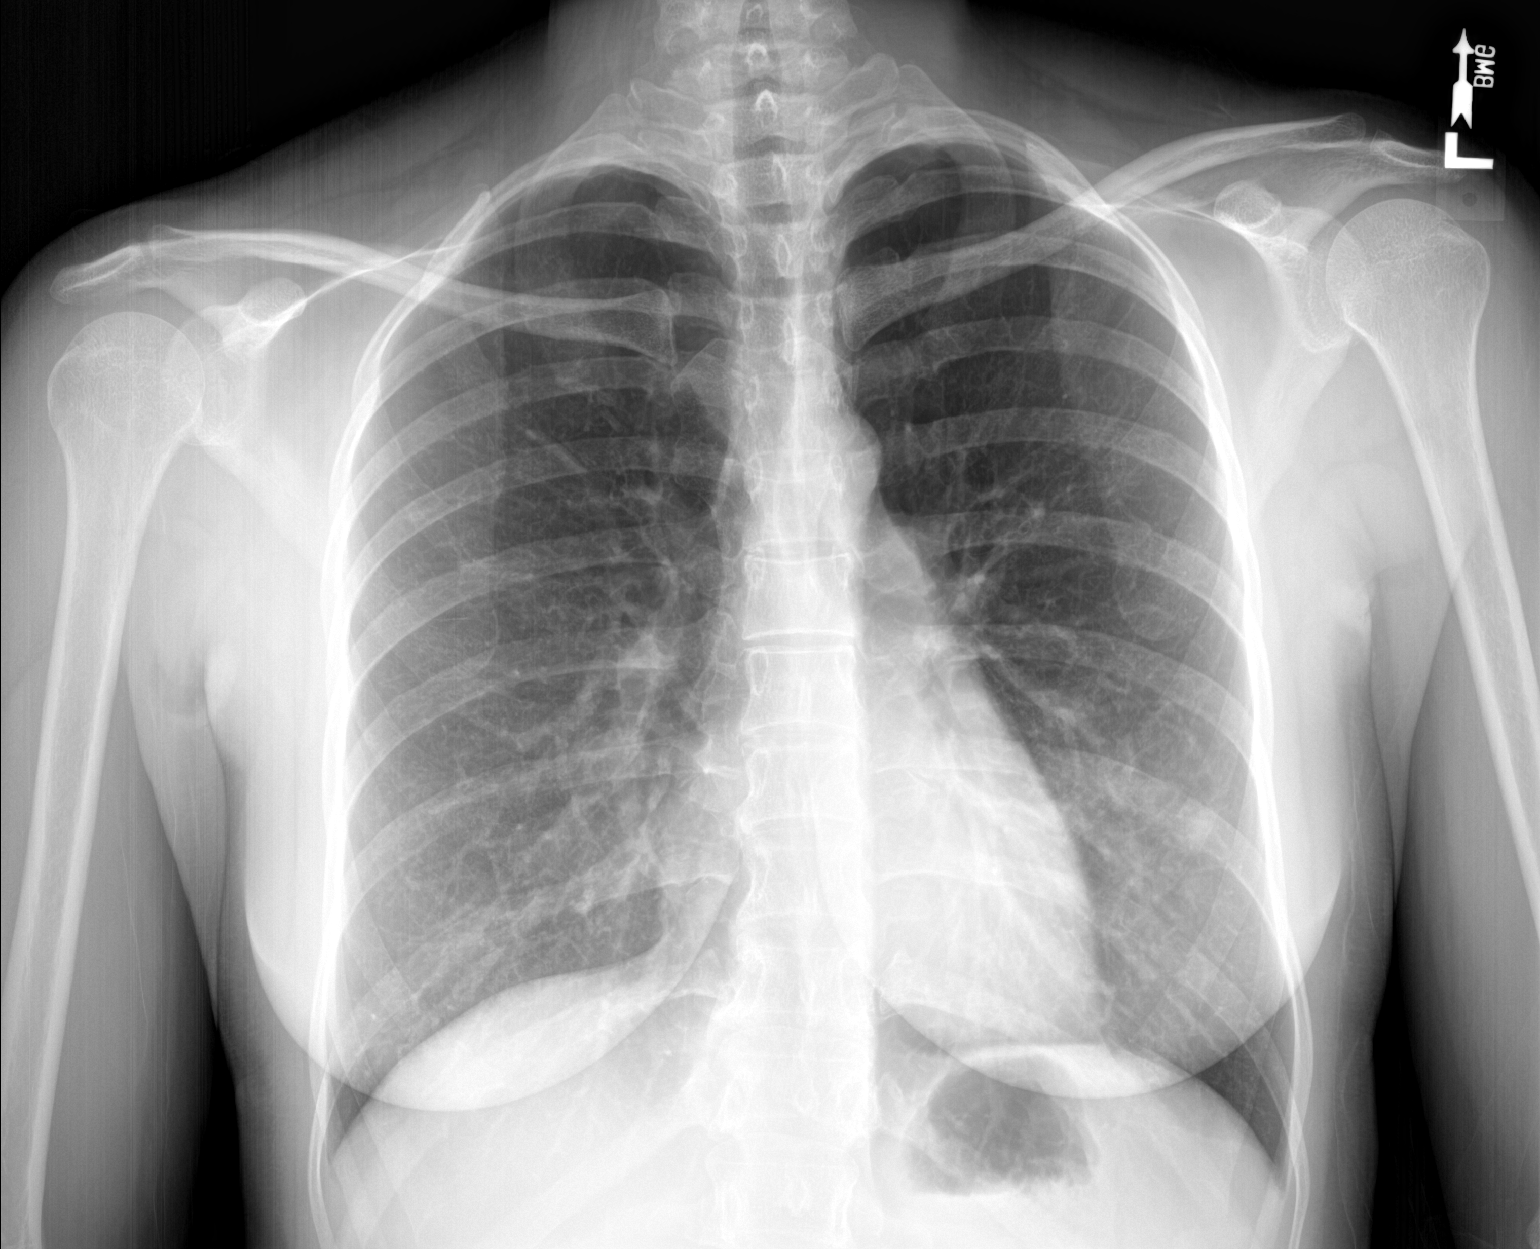

[chest lat]
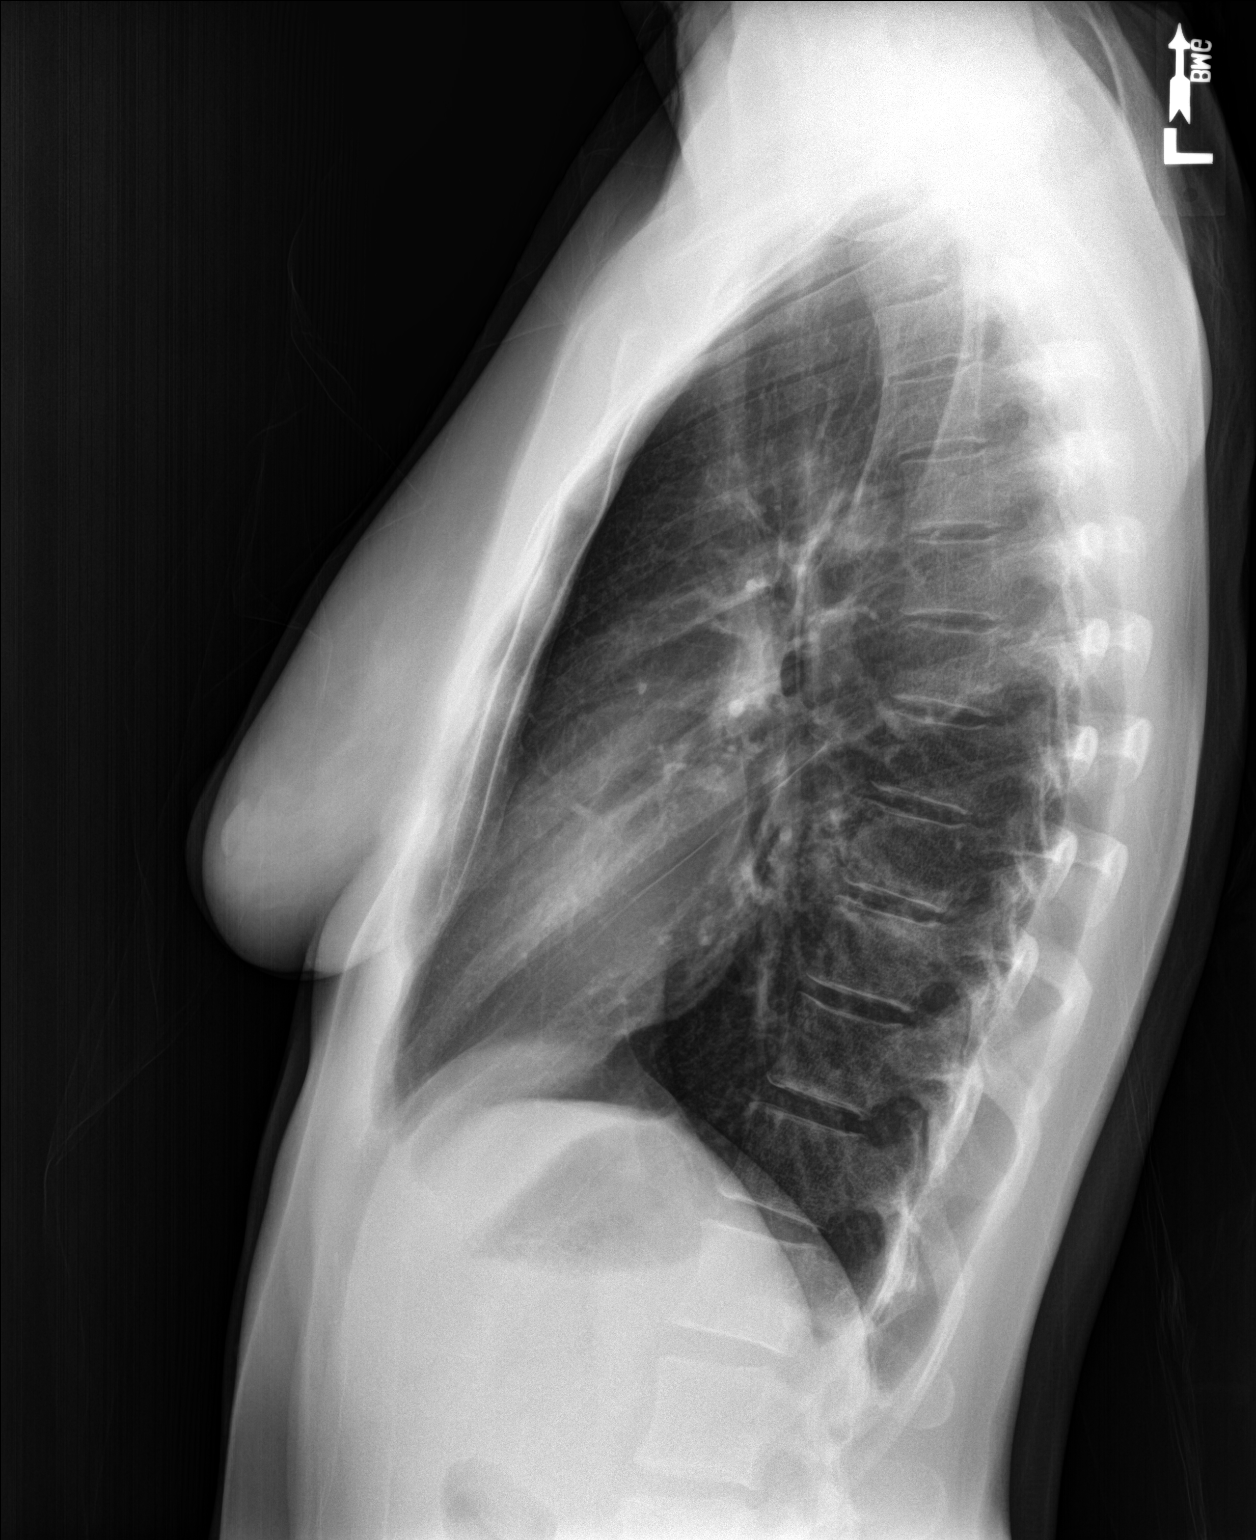

[2 of 2 positions shown; findings below may reference images not displayed]

FINDINGS: There is focal airspace consolidation consistent with pneumonia in
the inferior lingula. Lungs elsewhere clear. Heart size and
pulmonary vascularity are normal. No adenopathy. No bone lesions.
IMPRESSION: Pneumonia in the inferior lingula. Lungs elsewhere clear. No
adenopathy.

These results will be called to the ordering clinician or
representative by the Radiologist Assistant, and communication
documented in the PACS or zVision Dashboard.

## 2018-08-31 IMAGING — DX DG CHEST 2V
2 series · 2 of 2 positions shown · non-contrast
Comparison: 10/13/2017

CLINICAL DATA: Community acquired pneumonia involving the left
lung.

EXAM:
CHEST  2 VIEW

[chest pa]
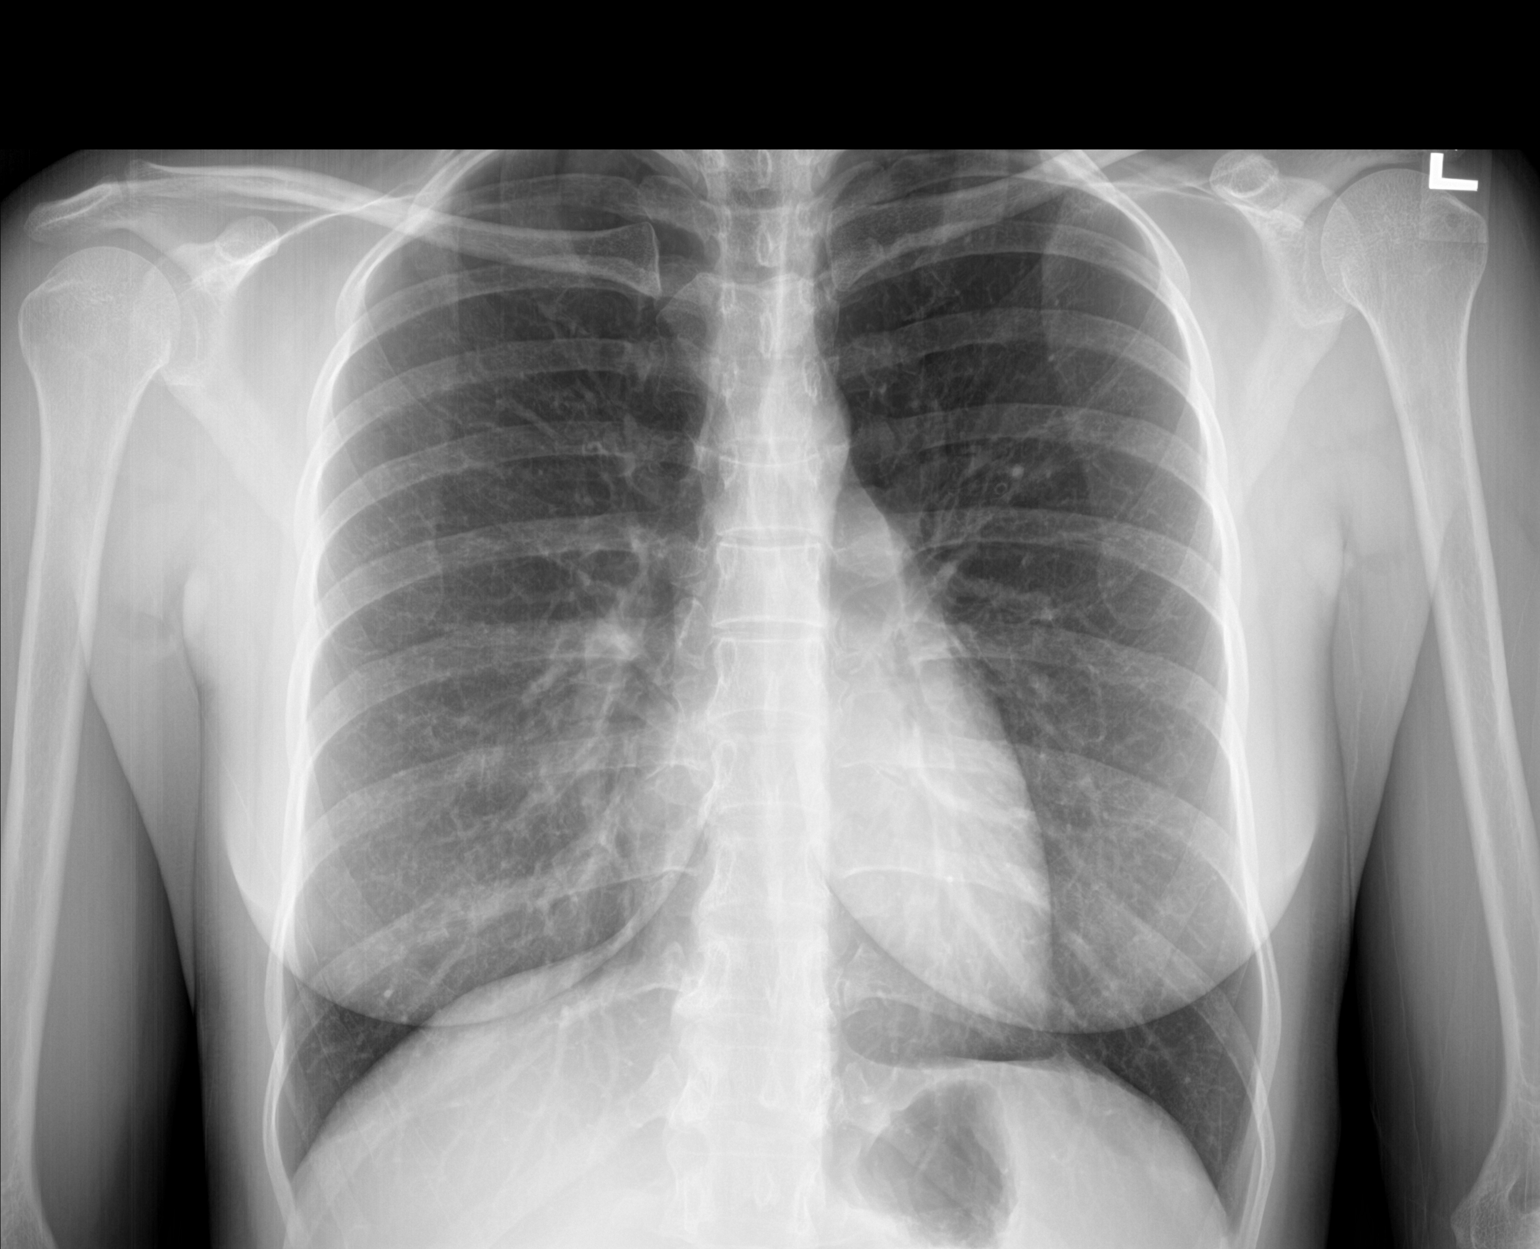

[chest lat]
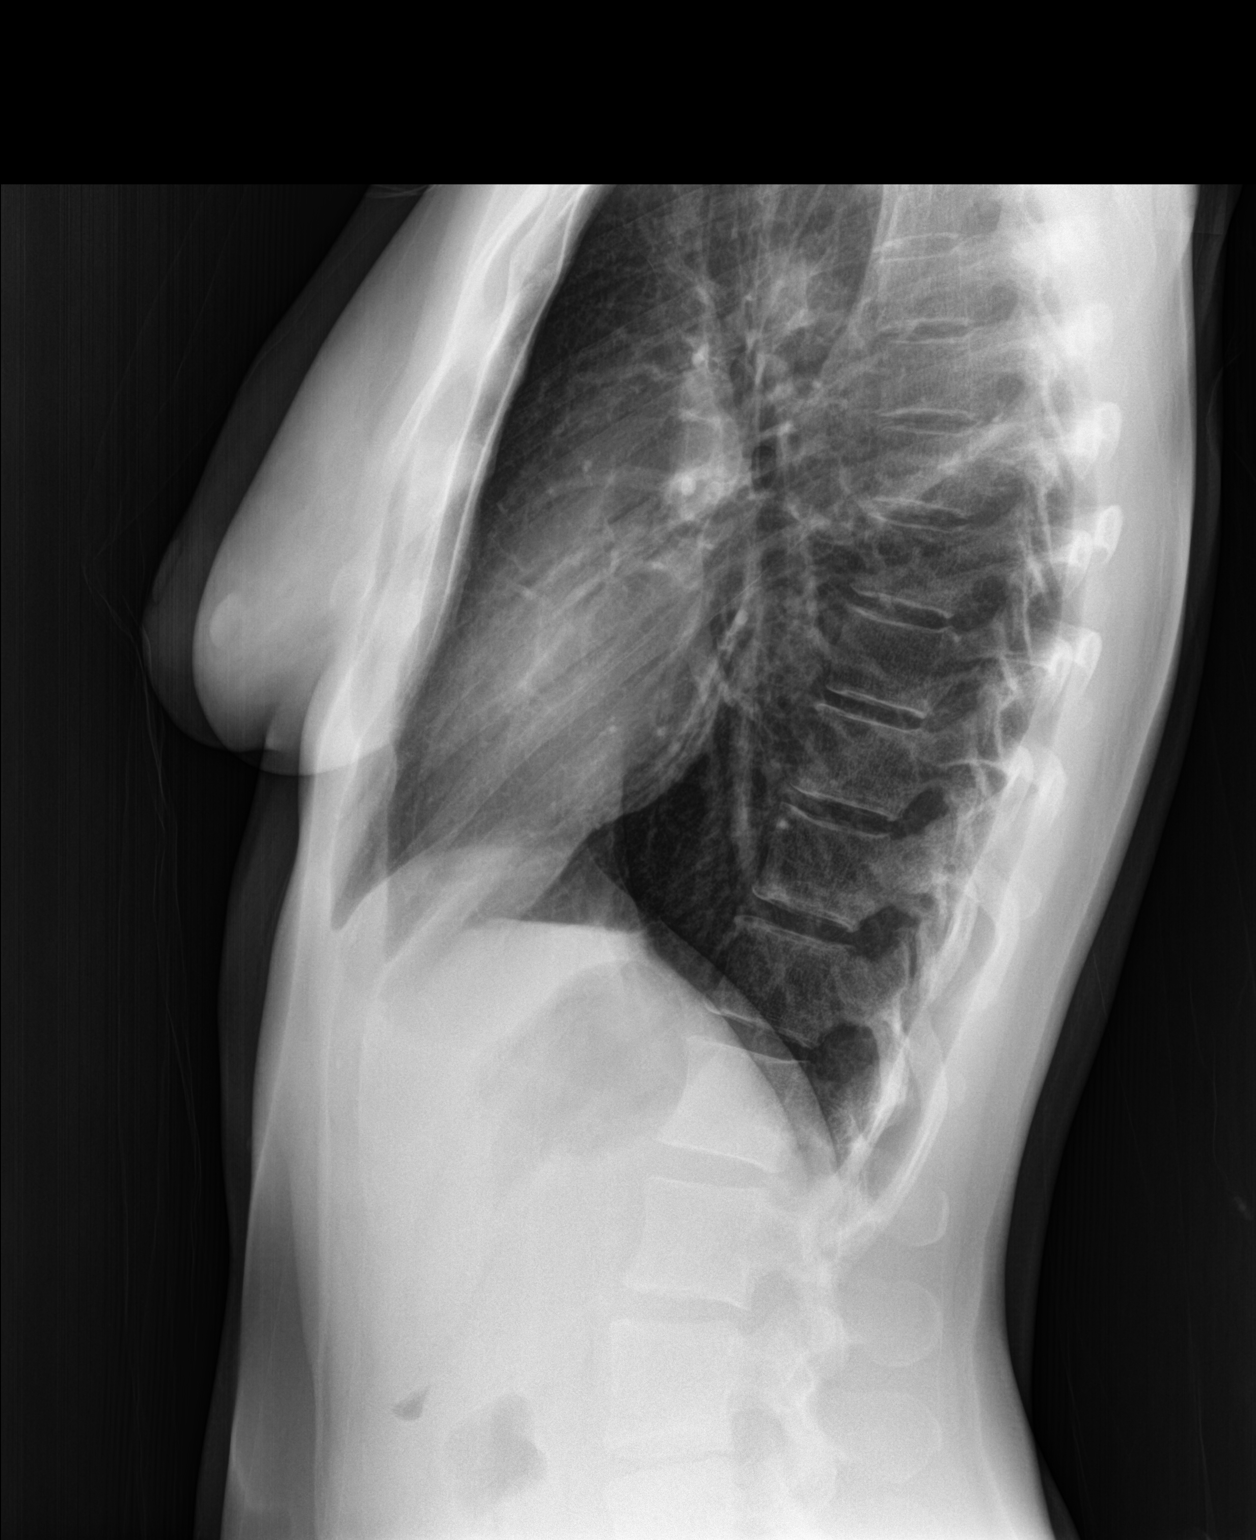

[2 of 2 positions shown; findings below may reference images not displayed]

FINDINGS: Grossly unchanged cardiac silhouette and mediastinal contours.
Ill-defined heterogeneous airspace opacities within the lingula have
resolved in the interval. No new focal airspace opacities. No
pleural effusion or pneumothorax. No evidence of edema. No acute
osseous abnormalities.
IMPRESSION: 1.  No acute cardiopulmonary disease.
2. Resolved lingular pneumonia.

## 2018-10-24 ENCOUNTER — Ambulatory Visit
Admission: EM | Admit: 2018-10-24 | Discharge: 2018-10-24 | Disposition: A | Discharge status: Home / Self Care | Payer: 59 | Attending: Family Medicine | Admitting: Family Medicine

## 2018-10-24 ENCOUNTER — Other Ambulatory Visit: Payer: Self-pay

## 2018-10-24 ENCOUNTER — Encounter: Payer: Self-pay | Admitting: Emergency Medicine

## 2018-10-24 DIAGNOSIS — J22 Unspecified acute lower respiratory infection: Secondary | ICD-10-CM | POA: Diagnosis not present

## 2018-10-24 MED ORDER — AMOXICILLIN-POT CLAVULANATE 400-57 MG/5ML PO SUSR: 875.0000 mg | Freq: Two times a day (BID) | ORAL | 0 refills | Status: AC

## 2018-10-24 NOTE — ED Provider Notes (Signed)
[facility]    CSN: [id number] Arrival date & time: 10/24/18  1419     History   Chief Complaint Chief Complaint  Patient presents with  . Cough  . Nasal Congestion    HPI Lindsey Barron is a 38 y.o. female.   Pt is a 28-year female presents with cough, congestion, chest pressure x2 weeks.  Her symptoms have been waxing and waning.  She took some doxycycline that she had at home based on previous diagnosis of pneumonia.  She took 3 days of the doxycycline and developed a rash to her abdomen.  She decided to stop the doxycycline because she thought she was having allergic reaction.  The rash went away.  She was feeling better with the doxycycline and then her symptoms returned once she stopped taking the medication.  She denies any fever, chills, body aches.  She had sinus infection and pneumonia approximately 1 year ago.  Reports similar symptoms as previous.  She does not smoke. No recent traveling.   ROS per HPI      History reviewed. No pertinent past medical history.  Patient Active Problem List   Diagnosis Date Noted  . ACUTE BRONCHITIS 07/27/2008    History reviewed. No pertinent surgical history.  OB History   No obstetric history on file.      Home Medications    Prior to Admission medications   Medication Sig Start Date End Date Taking? Authorizing Provider  amoxicillin-clavulanate (AUGMENTIN) 400-57 MG/5ML suspension Take 10.9 mLs (875 mg total) by mouth 2 (two) times daily for 7 days. 10/24/18 10/31/18  Orvan July, NP    Family History No family history on file.  Social History Social History   Tobacco Use  . Smoking status: Never Smoker  . Smokeless tobacco: Never Used  Substance Use Topics  . Alcohol use: No  . Drug use: No     Allergies   Patient has no known allergies.   Review of Systems Review of Systems   Physical Exam Triage Vital Signs ED Triage Vitals  Enc Vitals Group     BP 10/24/18 1434 107/70   Pulse Rate 10/24/18 1434 65     Resp --      Temp 10/24/18 1434 98 F (36.7 C)     Temp Source 10/24/18 1434 Oral     SpO2 10/24/18 1434 98 %     Weight 10/24/18 1435 120 lb (54.4 kg)     Height 10/24/18 1435 5\' 3"  (1.6 m)     Head Circumference --      Peak Flow --      Pain Score 10/24/18 1435 5     Pain Loc --      Pain Edu? --      Excl. in [place]? --    No data found.  Updated Vital Signs BP 107/70 (BP Location: Right Arm)   Pulse 65   Temp 98 F (36.7 C) (Oral)   Ht 5\' 3"  (1.6 m)   Wt 120 lb (54.4 kg)   SpO2 98%   BMI 21.26 kg/m   Visual Acuity Right Eye Distance:   Left Eye Distance:   Bilateral Distance:    Right Eye Near:   Left Eye Near:    Bilateral Near:     Physical Exam Vitals signs and nursing note reviewed.  Constitutional:      General: She is not in acute distress.    Appearance: Normal appearance. She is not ill-appearing, toxic-appearing or  diaphoretic.  HENT:     Head: Normocephalic and atraumatic.     Right Ear: Tympanic membrane, ear canal and external ear normal.     Left Ear: Tympanic membrane, ear canal and external ear normal.     Nose: Nose normal.     Mouth/Throat:     Pharynx: Oropharynx is clear.  Eyes:     Conjunctiva/sclera: Conjunctivae normal.  Neck:     Musculoskeletal: Normal range of motion.  Cardiovascular:     Rate and Rhythm: Normal rate and regular rhythm.     Pulses: Normal pulses.     Heart sounds: Normal heart sounds.  Pulmonary:     Effort: Pulmonary effort is normal.     Breath sounds: Normal breath sounds.  Musculoskeletal: Normal range of motion.  Lymphadenopathy:     Cervical: No cervical adenopathy.  Skin:    General: Skin is warm and dry.  Neurological:     Mental Status: She is alert.  Psychiatric:        Mood and Affect: Mood normal.      UC Treatments / Results  Labs (all labs ordered are listed, but only abnormal results are displayed) Labs Reviewed - No data to  display  EKG None  Radiology No results found.  Procedures Procedures (including critical care time)  Medications Ordered in UC Medications - No data to display  Initial Impression / Assessment and Plan / UC Course  I have reviewed the triage vital signs and the nursing notes.  Pertinent labs & imaging results that were available during my care of the patient were reviewed by me and considered in my medical decision making (see chart for details).     Lower respiratory tract infection  No significant findings on exam Patient is a 38 year old female with approximately 2 weeks of cough congestion that is not getting better.  She was better with the doxycycline but had to stop based on the rash.  We will go ahead and treat for pneumonia today with Augmentin Twice a day for 7 days Follow up as needed for continued or worsening symptoms  Final Clinical Impressions(s) / UC Diagnoses   Final diagnoses:  Lower respiratory tract infection     Discharge Instructions     We will go ahead and treat for respiratory tract infection Augmentin twice a day for 7 days.  Follow up as needed for continued or worsening symptoms     ED Prescriptions    Medication Sig Dispense Auth. Provider   amoxicillin-clavulanate (AUGMENTIN) 400-57 MG/5ML suspension Take 10.9 mLs (875 mg total) by mouth 2 (two) times daily for 7 days. 100 mL Loura Halt A, NP     Controlled Substance Prescriptions [place] Controlled Substance Registry consulted? Not Applicable   Orvan July, NP 10/24/18 1506

## 2018-10-24 NOTE — ED Triage Notes (Signed)
Per pt she has been having a cough and severe discomfort in her chest due to the cough. Pt has a hard time taking deep breathes. Pt has not had any fevers but some chills. Pt has hx of pnemonia

## 2018-10-24 NOTE — Discharge Instructions (Addendum)
We will go ahead and treat for respiratory tract infection Augmentin twice a day for 7 days.  Follow up as needed for continued or worsening symptoms

## 2019-03-23 ENCOUNTER — Ambulatory Visit (INDEPENDENT_AMBULATORY_CARE_PROVIDER_SITE_OTHER): Payer: 59

## 2019-03-23 ENCOUNTER — Ambulatory Visit: Payer: 59 | Admitting: Family Medicine

## 2019-03-23 ENCOUNTER — Encounter: Payer: Self-pay | Admitting: *Deleted

## 2019-03-23 ENCOUNTER — Other Ambulatory Visit: Payer: Self-pay

## 2019-03-23 ENCOUNTER — Encounter: Payer: Self-pay | Admitting: Family Medicine

## 2019-03-23 VITALS — BP 98/62 | HR 71 | Temp 98.2°F | Wt 131.2 lb

## 2019-03-23 DIAGNOSIS — M545 Low back pain, unspecified: Secondary | ICD-10-CM

## 2019-03-23 DIAGNOSIS — M546 Pain in thoracic spine: Secondary | ICD-10-CM

## 2019-03-23 DIAGNOSIS — G8929 Other chronic pain: Secondary | ICD-10-CM

## 2019-03-23 NOTE — Progress Notes (Signed)
   Subjective:    Patient ID: Katina Degree, female    DOB: 1981/05/28, 38 y.o.   MRN: [id number]  HPI Here for 2 months of intermittent tightness and mild pain in the lower back. No hx of trauma. She never treats the pain because it is mild. No pain or numbness or tingling in the legs.    Review of Systems  Constitutional: Negative.   Respiratory: Negative.   Cardiovascular: Negative.   Musculoskeletal: Positive for back pain.  Neurological: Negative for weakness and numbness.       Objective:   Physical Exam Constitutional:      Appearance: Normal appearance.  Cardiovascular:     Rate and Rhythm: Normal rate and regular rhythm.     Pulses: Normal pulses.     Heart sounds: Normal heart sounds.  Pulmonary:     Effort: Pulmonary effort is normal.     Breath sounds: Normal breath sounds.  Musculoskeletal:     Comments: Mildly tender over the lower thoracic and upper lumbar spine. Full ROM. No spasm. No scoliosis. Xrays of thoracic and lumbar spine are normal  Neurological:     General: No focal deficit present.     Mental Status: She is alert and oriented to person, place, and time.           Assessment & Plan:  Low back pain. She can use heat and Ibuprofen as needed. I suggested she do something like yoga to strengthen her core muscles. Recheck prn.  Alysia Penna, MD

## 2019-07-06 ENCOUNTER — Telehealth: Payer: Self-pay | Admitting: Family Medicine

## 2019-07-06 NOTE — Telephone Encounter (Signed)
Pt called and stated that she was in the ER this morning at 1am for allergic reaction. Unknown reason. ER sent in epi pen and pt needs a PA for insurance. Pt would like a call back from the nurse if possible . Pt states that the medications is at the pharmacy [facility] [address] [place], [place] [address]. Pt would like to know if we can do PA. Please advise

## 2019-07-06 NOTE — Telephone Encounter (Signed)
Pt called back and stated she needs the PA for the epi pen approved so she can get the Rx.

## 2019-07-07 NOTE — Telephone Encounter (Signed)
Patient was seen in the ED and  prednisone was prescribed.Patient hives have not improved, no temperature. Since the ED doctor and patient cant rule out what caused the allergic reaction,. ED doctor recommened patient request from PCP a epi pen. Patient is currently on vacation, please advise today as per patient request.    Pharmacy listed in initial message. Patient best contact # [phone]

## 2019-07-09 ENCOUNTER — Encounter: Payer: Self-pay | Admitting: Family Medicine

## 2019-07-09 ENCOUNTER — Telehealth (INDEPENDENT_AMBULATORY_CARE_PROVIDER_SITE_OTHER): Payer: 59 | Admitting: Family Medicine

## 2019-07-09 DIAGNOSIS — L509 Urticaria, unspecified: Secondary | ICD-10-CM | POA: Diagnosis not present

## 2019-07-09 MED ORDER — EPINEPHRINE 0.3 MG/0.3ML IJ SOAJ: 0.3000 mg | INTRAMUSCULAR | 0 refills | Status: AC | PRN

## 2019-07-09 NOTE — Telephone Encounter (Signed)
Patient is calling back and expressed understanding. Patient is eager to receive the medication and she will await PA approval. Thank you

## 2019-07-09 NOTE — Telephone Encounter (Signed)
Called pt to advised that a prior Auth will be needed and it may take 2 days. However, pt did not answer. Will call Monday and try to start Prior Auth A.S.A.P.

## 2019-07-09 NOTE — Progress Notes (Signed)
Virtual Visit via Video Note  I connected with the patient on 07/09/19 at 10:45 AM EDT by a video enabled telemedicine application and verified that I am speaking with the correct person using two identifiers.  Location patient: home Location provider:work or home office Persons participating in the virtual visit: patient, provider  I discussed the limitations of evaluation and management by telemedicine and the availability of in person appointments. The patient expressed understanding and agreed to proceed.   HPI: Here to follow up an ER visit on 07-06-19 in [place], [place] for hives. She had never reacted to anything in her life before that day. She had eaten one bite of her husband's shrimp [employer] sandwich at lunch, along with her steak and cheese sandwich. About an hour later she began to have an itchy rash on the arms, legs, and abdomen. She took Benadryl and this helped, but the rash did not go away. Then she developed some swelling around her eyes and she vomited once, so she decided to go to the ER. While there she received shots of steroids and Benadryl, and she quickly felt better. She was sent home on 5 days of Prednisone 40 mg daily and an EpiPen. She has improved steadily since then, and she was able to sleep throughthe night last night. She has some mild itching occasionally but the rash is gone.    ROS: See pertinent positives and negatives per HPI.  No past medical history on file.  No past surgical history on file.  No family history on file.   Current Outpatient Medications:  .  EPINEPHrine (EPIPEN 2-PAK) 0.3 mg/0.3 mL IJ SOAJ injection, Inject 0.3 mLs (0.3 mg total) into the muscle as needed for anaphylaxis., Disp: 1 each, Rfl: 0 .  levonorgestrel-ethinyl estradiol (SRONYX) 0.1-20 MG-MCG tablet, Take 1 tablet by mouth daily., Disp: , Rfl:   EXAM:  VITALS per patient if applicable:  GENERAL: alert, oriented, appears well and in no acute distress  HEENT: atraumatic,  conjunttiva clear, no obvious abnormalities on inspection of external nose and ears  NECK: normal movements of the head and neck  LUNGS: on inspection no signs of respiratory distress, breathing rate appears normal, no obvious gross SOB, gasping or wheezing  CV: no obvious cyanosis  MS: moves all visible extremities without noticeable abnormality  PSYCH/NEURO: pleasant and cooperative, no obvious depression or anxiety, speech and thought processing grossly intact  ASSESSMENT AND PLAN: Hives, likely as a reaction to shrimp. I advised her to avoid all shellfish in the future. For the next week I suggested she take Zyrtec bid. She has set up an appt with [facility] for 08-09-19 for allergy testing.  Alysia Penna, MD  Discussed the following assessment and plan:  No diagnosis found.     I discussed the assessment and treatment plan with the patient. The patient was provided an opportunity to ask questions and all were answered. The patient agreed with the plan and demonstrated an understanding of the instructions.   The patient was advised to call back or seek an in-person evaluation if the symptoms worsen or if the condition fails to improve as anticipated.

## 2019-07-13 NOTE — Telephone Encounter (Signed)
Pt calling back to check status. Please advise. Pt seems upset that this has not been done yet.

## 2019-07-13 NOTE — Telephone Encounter (Signed)
Spoke to pt earlier and advised that there was an issue with the prior auth. No Bin number grourp number etc. Was provided by the pharmacy. Was on hold with pt insurance company for 30 min without help. Called cover my meds and they stated that the pharmacy had not started the prior auth so that's why the prior auth was having trouble processing. Agent from cover my meds advised to reach out to the preferred pharmacy for advise. I called pharmacy and they advised the the Rx was approved and it eas at another pharmacy not mentioned in phone note. Will call pt to see if this Rx was obtained.

## 2019-07-14 NOTE — Telephone Encounter (Signed)
Pt notified of update. Pt will pick up Rx. Pharmacy has it for $5.

## 2023-05-08 ENCOUNTER — Other Ambulatory Visit: Payer: Self-pay | Admitting: Obstetrics and Gynecology

## 2023-05-08 DIAGNOSIS — R928 Other abnormal and inconclusive findings on diagnostic imaging of breast: Secondary | ICD-10-CM

## 2023-05-19 ENCOUNTER — Ambulatory Visit: Payer: Self-pay

## 2023-05-19 ENCOUNTER — Ambulatory Visit
Admission: RE | Admit: 2023-05-19 | Discharge: 2023-05-19 | Disposition: A | Discharge status: Home / Self Care | Payer: Self-pay | Source: Ambulatory Visit | Attending: Obstetrics and Gynecology | Admitting: Obstetrics and Gynecology

## 2023-05-19 DIAGNOSIS — R928 Other abnormal and inconclusive findings on diagnostic imaging of breast: Secondary | ICD-10-CM

## 2024-05-14 ENCOUNTER — Other Ambulatory Visit (HOSPITAL_BASED_OUTPATIENT_CLINIC_OR_DEPARTMENT_OTHER): Payer: Self-pay | Admitting: Internal Medicine

## 2024-05-14 DIAGNOSIS — E7800 Pure hypercholesterolemia, unspecified: Secondary | ICD-10-CM

## 2024-05-31 ENCOUNTER — Ambulatory Visit (HOSPITAL_BASED_OUTPATIENT_CLINIC_OR_DEPARTMENT_OTHER)
Admission: RE | Admit: 2024-05-31 | Discharge: 2024-05-31 | Disposition: A | Discharge status: Home / Self Care | Payer: Self-pay | Source: Ambulatory Visit | Attending: Internal Medicine | Admitting: Internal Medicine

## 2024-05-31 DIAGNOSIS — E7800 Pure hypercholesterolemia, unspecified: Secondary | ICD-10-CM | POA: Insufficient documentation

## 2024-06-24 ENCOUNTER — Other Ambulatory Visit: Payer: Self-pay | Admitting: Obstetrics and Gynecology

## 2024-06-24 DIAGNOSIS — Z1231 Encounter for screening mammogram for malignant neoplasm of breast: Secondary | ICD-10-CM

## 2024-06-30 ENCOUNTER — Encounter

## 2024-06-30 DIAGNOSIS — Z1231 Encounter for screening mammogram for malignant neoplasm of breast: Secondary | ICD-10-CM

## 2024-08-02 ENCOUNTER — Ambulatory Visit
Admission: RE | Admit: 2024-08-02 | Discharge: 2024-08-02 | Disposition: A | Discharge status: Home / Self Care | Source: Ambulatory Visit | Attending: Obstetrics and Gynecology | Admitting: Obstetrics and Gynecology

## 2024-08-02 DIAGNOSIS — Z1231 Encounter for screening mammogram for malignant neoplasm of breast: Secondary | ICD-10-CM

## 2024-08-05 ENCOUNTER — Other Ambulatory Visit: Payer: Self-pay | Admitting: Obstetrics and Gynecology

## 2024-08-05 DIAGNOSIS — R928 Other abnormal and inconclusive findings on diagnostic imaging of breast: Secondary | ICD-10-CM

## 2024-08-18 ENCOUNTER — Ambulatory Visit
Admission: RE | Admit: 2024-08-18 | Discharge: 2024-08-18 | Disposition: A | Discharge status: Home / Self Care | Source: Ambulatory Visit | Attending: Obstetrics and Gynecology | Admitting: Obstetrics and Gynecology

## 2024-08-18 ENCOUNTER — Ambulatory Visit
Admission: RE | Admit: 2024-08-18 | Discharge: 2024-08-18 | Disposition: A | Source: Ambulatory Visit | Attending: Obstetrics and Gynecology | Admitting: Obstetrics and Gynecology

## 2024-08-18 DIAGNOSIS — R928 Other abnormal and inconclusive findings on diagnostic imaging of breast: Secondary | ICD-10-CM
